# Patient Record
Sex: Female | Born: 1970 | Race: White | Hispanic: No | Marital: Married
Health system: Southern US, Community
[De-identification: ages and names within clinical notes are randomized; demographics above are authoritative.]

---

## 1999-10-13 ENCOUNTER — Ambulatory Visit (HOSPITAL_COMMUNITY): Admission: RE | Admit: 1999-10-13 | Discharge: 1999-10-13 | Payer: Self-pay | Admitting: Obstetrics and Gynecology

## 1999-10-13 ENCOUNTER — Encounter: Payer: Self-pay | Admitting: Obstetrics and Gynecology

## 2000-03-05 ENCOUNTER — Inpatient Hospital Stay (HOSPITAL_COMMUNITY): Admission: AD | Admit: 2000-03-05 | Discharge: 2000-03-07 | Payer: Self-pay | Admitting: Obstetrics and Gynecology

## 2000-06-28 ENCOUNTER — Other Ambulatory Visit: Admission: RE | Admit: 2000-06-28 | Discharge: 2000-06-28 | Payer: Self-pay | Admitting: Obstetrics and Gynecology

## 2005-02-22 ENCOUNTER — Encounter: Admission: RE | Admit: 2005-02-22 | Discharge: 2005-02-22 | Payer: Self-pay | Admitting: Obstetrics and Gynecology

## 2010-09-15 ENCOUNTER — Other Ambulatory Visit: Payer: Self-pay | Admitting: Obstetrics and Gynecology

## 2010-09-15 DIAGNOSIS — Z1231 Encounter for screening mammogram for malignant neoplasm of breast: Secondary | ICD-10-CM

## 2010-10-10 ENCOUNTER — Ambulatory Visit
Admission: RE | Admit: 2010-10-10 | Discharge: 2010-10-10 | Disposition: A | Discharge status: Home / Self Care | Payer: BC Managed Care – PPO | Source: Ambulatory Visit | Attending: Obstetrics and Gynecology | Admitting: Obstetrics and Gynecology

## 2010-10-10 DIAGNOSIS — Z1231 Encounter for screening mammogram for malignant neoplasm of breast: Secondary | ICD-10-CM

## 2010-10-13 ENCOUNTER — Other Ambulatory Visit: Payer: Self-pay | Admitting: Obstetrics and Gynecology

## 2010-10-13 DIAGNOSIS — R928 Other abnormal and inconclusive findings on diagnostic imaging of breast: Secondary | ICD-10-CM

## 2010-10-18 ENCOUNTER — Ambulatory Visit
Admission: RE | Admit: 2010-10-18 | Discharge: 2010-10-18 | Disposition: A | Discharge status: Home / Self Care | Payer: BC Managed Care – PPO | Source: Ambulatory Visit | Attending: Obstetrics and Gynecology | Admitting: Obstetrics and Gynecology

## 2010-10-18 DIAGNOSIS — R928 Other abnormal and inconclusive findings on diagnostic imaging of breast: Secondary | ICD-10-CM

## 2011-11-19 ENCOUNTER — Other Ambulatory Visit: Payer: Self-pay | Admitting: Obstetrics and Gynecology

## 2011-11-19 DIAGNOSIS — Z1231 Encounter for screening mammogram for malignant neoplasm of breast: Secondary | ICD-10-CM

## 2011-12-05 ENCOUNTER — Ambulatory Visit
Admission: RE | Admit: 2011-12-05 | Discharge: 2011-12-05 | Disposition: A | Discharge status: Home / Self Care | Payer: BC Managed Care – PPO | Source: Ambulatory Visit | Attending: Obstetrics and Gynecology | Admitting: Obstetrics and Gynecology

## 2011-12-05 DIAGNOSIS — Z1231 Encounter for screening mammogram for malignant neoplasm of breast: Secondary | ICD-10-CM

## 2017-09-17 DIAGNOSIS — J3489 Other specified disorders of nose and nasal sinuses: Secondary | ICD-10-CM | POA: Insufficient documentation

## 2018-12-26 ENCOUNTER — Other Ambulatory Visit: Payer: Self-pay

## 2018-12-26 DIAGNOSIS — Z20822 Contact with and (suspected) exposure to covid-19: Secondary | ICD-10-CM

## 2018-12-27 LAB — NOVEL CORONAVIRUS, NAA: SARS-CoV-2, NAA: NOT DETECTED

## 2019-03-23 ENCOUNTER — Other Ambulatory Visit: Payer: Self-pay | Admitting: Obstetrics and Gynecology

## 2019-03-23 DIAGNOSIS — Z1211 Encounter for screening for malignant neoplasm of colon: Secondary | ICD-10-CM

## 2019-03-31 ENCOUNTER — Other Ambulatory Visit: Payer: Self-pay | Admitting: Obstetrics and Gynecology

## 2019-03-31 DIAGNOSIS — N6489 Other specified disorders of breast: Secondary | ICD-10-CM

## 2019-03-31 HISTORY — PX: BREAST BIOPSY: SHX20

## 2019-04-09 ENCOUNTER — Other Ambulatory Visit: Payer: Self-pay

## 2019-04-09 ENCOUNTER — Ambulatory Visit
Admission: RE | Admit: 2019-04-09 | Discharge: 2019-04-09 | Disposition: A | Discharge status: Home / Self Care | Payer: Managed Care, Other (non HMO) | Source: Ambulatory Visit | Attending: Obstetrics and Gynecology | Admitting: Obstetrics and Gynecology

## 2019-04-09 ENCOUNTER — Other Ambulatory Visit: Payer: Self-pay | Admitting: Obstetrics and Gynecology

## 2019-04-09 DIAGNOSIS — N6489 Other specified disorders of breast: Secondary | ICD-10-CM

## 2019-04-10 ENCOUNTER — Ambulatory Visit
Admission: RE | Admit: 2019-04-10 | Discharge: 2019-04-10 | Disposition: A | Discharge status: Home / Self Care | Payer: Managed Care, Other (non HMO) | Source: Ambulatory Visit | Attending: Obstetrics and Gynecology | Admitting: Obstetrics and Gynecology

## 2019-04-10 ENCOUNTER — Other Ambulatory Visit: Payer: Self-pay

## 2019-04-10 DIAGNOSIS — N6489 Other specified disorders of breast: Secondary | ICD-10-CM

## 2019-07-27 ENCOUNTER — Ambulatory Visit: Payer: Managed Care, Other (non HMO)

## 2019-09-03 ENCOUNTER — Other Ambulatory Visit: Payer: Self-pay

## 2019-09-03 ENCOUNTER — Ambulatory Visit (INDEPENDENT_AMBULATORY_CARE_PROVIDER_SITE_OTHER): Payer: Managed Care, Other (non HMO) | Admitting: Family Medicine

## 2019-09-03 ENCOUNTER — Encounter: Payer: Self-pay | Admitting: Family Medicine

## 2019-09-03 VITALS — BP 104/70 | HR 82 | Ht 61.5 in | Wt 130.2 lb

## 2019-09-03 DIAGNOSIS — Z0000 Encounter for general adult medical examination without abnormal findings: Secondary | ICD-10-CM | POA: Diagnosis not present

## 2019-09-03 DIAGNOSIS — M545 Low back pain, unspecified: Secondary | ICD-10-CM | POA: Insufficient documentation

## 2019-09-03 NOTE — Progress Notes (Signed)
   Office Visit Note   Patient: Laurie Branch           Date of Birth: 05-09-1970           MRN: [id number] Visit Date: 09/03/2019 Requested by: No referring provider defined for this encounter. PCP: Patient, No Pcp Per  Subjective: Chief Complaint  Patient presents with  . Annual Exam    HPI: She's here for wellness exam.  No chronic problems, no current complaints.  GYN visits up to date.  UTD on eye and dental exams.  Hasn't had colon cancer screening.  She sees the dermatologist every couple years.               ROS: No headaches, visual disturbance, chest pain, shortness of breath, palpitations, abdominal pain, constipation or diarrhea.  No joint aches or pains.  All other systems were reviewed and are negative.  Objective: Vital Signs: BP 104/70   Pulse 82   Ht 5' 1.5" (1.562 m)   Wt 130 lb 3.2 oz (59.1 kg)   BMI 24.20 kg/m   Physical Exam:  General:  Alert and oriented, in no acute distress. Pulm:  Breathing unlabored. Psy:  Normal mood, congruent affect. Skin: No suspicious lesions. HEENT:  [place]/AT, PERRLA, EOM Full, no nystagmus.  Funduscopic examination within normal limits.  No conjunctival erythema.  Tympanic membranes are pearly gray with normal landmarks.  External ear canals are normal.  Nasal passages are clear.  Oropharynx is clear.  No significant lymphadenopathy.  No thyromegaly or nodules.  2+ carotid pulses without bruits. CV: Regular rate and rhythm without murmurs, rubs, or gallops.  No peripheral edema.  2+ radial and posterior tibial pulses. Lungs: Clear to auscultation throughout with no wheezing or areas of consolidation. Abd: Bowel sounds are active, no hepatosplenomegaly or masses.  Soft and nontender.  No audible bruits.  No evidence of ascites.   Imaging: No results found.  Assessment & Plan: 1.  Wellness examination -Labs today, Cologuard ordered. -Follow-up annually, sooner for any problems.     Procedures: No  procedures performed  No notes on file     PMFS History: Patient Active Problem List   Diagnosis Date Noted  . Low back pain 09/03/2019  . Nasal lesion 09/17/2017   History reviewed. No pertinent past medical history.  Family History  Problem Relation Age of Onset  . Asthma Maternal Grandmother   . Cancer Neg Hx   . Diabetes Neg Hx   . Heart disease Neg Hx     History reviewed. No pertinent surgical history. Social History   Occupational History  . Not on file  Tobacco Use  . Smoking status: Not on file  Substance and Sexual Activity  . Alcohol use: Not on file  . Drug use: Not on file  . Sexual activity: Not on file

## 2019-09-04 ENCOUNTER — Telehealth: Payer: Self-pay | Admitting: Family Medicine

## 2019-09-04 LAB — THYROID PANEL WITH TSH
Free Thyroxine Index: 2.5 (ref 1.4–3.8)
T3 Uptake: 33 % (ref 22–35)
T4, Total: 7.7 ug/dL (ref 5.1–11.9)
TSH: 0.81 mIU/L

## 2019-09-04 LAB — LIPID PANEL
Cholesterol: 188 mg/dL (ref <200)
HDL: 59 mg/dL (ref >=50)
LDL Cholesterol (Calc): 110 mg/dL (calc) — ABNORMAL HIGH
Non-HDL Cholesterol (Calc): 129 mg/dL (calc) (ref <130)
Total CHOL/HDL Ratio: 3.2 (calc) (ref <5.0)
Triglycerides: 94 mg/dL (ref <150)

## 2019-09-04 LAB — CBC WITH DIFFERENTIAL/PLATELET
Absolute Monocytes: 400 cells/uL (ref 200–950)
Basophils Absolute: 30 cells/uL (ref 0–200)
Basophils Relative: 0.6 %
Eosinophils Absolute: 50 cells/uL (ref 15–500)
Eosinophils Relative: 1 %
HCT: 38.7 % (ref 35.0–45.0)
Hemoglobin: 12.7 g/dL (ref 11.7–15.5)
Lymphs Abs: 1025 cells/uL (ref 850–3900)
MCH: 30.8 pg (ref 27.0–33.0)
MCHC: 32.8 g/dL (ref 32.0–36.0)
MCV: 93.9 fL (ref 80.0–100.0)
MPV: 11.4 fL (ref 7.5–12.5)
Monocytes Relative: 8 %
Neutro Abs: 3495 cells/uL (ref 1500–7800)
Neutrophils Relative %: 69.9 %
Platelets: 193 10*3/uL (ref 140–400)
RBC: 4.12 10*6/uL (ref 3.80–5.10)
RDW: 11.6 % (ref 11.0–15.0)
Total Lymphocyte: 20.5 %
WBC: 5 10*3/uL (ref 3.8–10.8)

## 2019-09-04 LAB — COMPREHENSIVE METABOLIC PANEL
AG Ratio: 1.9 (calc) (ref 1.0–2.5)
ALT: 12 U/L (ref 6–29)
AST: 13 U/L (ref 10–35)
Albumin: 4 g/dL (ref 3.6–5.1)
Alkaline phosphatase (APISO): 40 U/L (ref 37–153)
BUN: 9 mg/dL (ref 7–25)
CO2: 28 mmol/L (ref 20–32)
Calcium: 9.1 mg/dL (ref 8.6–10.4)
Chloride: 108 mmol/L (ref 98–110)
Creat: 0.62 mg/dL (ref 0.50–1.05)
Globulin: 2.1 g/dL (calc) (ref 1.9–3.7)
Glucose, Bld: 90 mg/dL (ref 65–99)
Potassium: 4.3 mmol/L (ref 3.5–5.3)
Sodium: 142 mmol/L (ref 135–146)
Total Bilirubin: 0.9 mg/dL (ref 0.2–1.2)
Total Protein: 6.1 g/dL (ref 6.1–8.1)

## 2019-09-04 LAB — VITAMIN D 25 HYDROXY (VIT D DEFICIENCY, FRACTURES): Vit D, 25-Hydroxy: 20 ng/mL — ABNORMAL LOW (ref 30–100)

## 2019-09-04 LAB — HIGH SENSITIVITY CRP: hs-CRP: 2.3 mg/L

## 2019-09-04 NOTE — Telephone Encounter (Signed)
Labs are notable for the following:  Vitamin D is very low at 20.  We want this to be 50-80.  I recommend taking vitamin D3, 5000 IU tablets, 2 of them daily for 3 months and then one daily long-term after that.  Consider rechecking in 6 to 12 months to be sure it is in target range.  All other labs look good.

## 2020-07-13 ENCOUNTER — Other Ambulatory Visit: Payer: Self-pay | Admitting: Obstetrics and Gynecology

## 2020-07-13 DIAGNOSIS — R928 Other abnormal and inconclusive findings on diagnostic imaging of breast: Secondary | ICD-10-CM

## 2020-07-29 ENCOUNTER — Ambulatory Visit: Payer: Managed Care, Other (non HMO)

## 2020-07-29 ENCOUNTER — Ambulatory Visit
Admission: RE | Admit: 2020-07-29 | Discharge: 2020-07-29 | Disposition: A | Discharge status: Home / Self Care | Payer: Managed Care, Other (non HMO) | Source: Ambulatory Visit | Attending: Obstetrics and Gynecology | Admitting: Obstetrics and Gynecology

## 2020-07-29 ENCOUNTER — Other Ambulatory Visit: Payer: Self-pay

## 2020-07-29 DIAGNOSIS — R928 Other abnormal and inconclusive findings on diagnostic imaging of breast: Secondary | ICD-10-CM

## 2021-08-07 IMAGING — MG MM DIGITAL DIAGNOSTIC UNILAT*L* W/ TOMO W/ CAD
4 series · 4 of 12 positions shown · non-contrast
Comparison: Previous exam(s).

CLINICAL DATA: Screening recall for a possible asymmetry in the
left breast.

EXAM:
DIGITAL DIAGNOSTIC LEFT MAMMOGRAM WITH CAD AND TOMO
ULTRASOUND LEFT BREAST

[L MLO synth-2D]
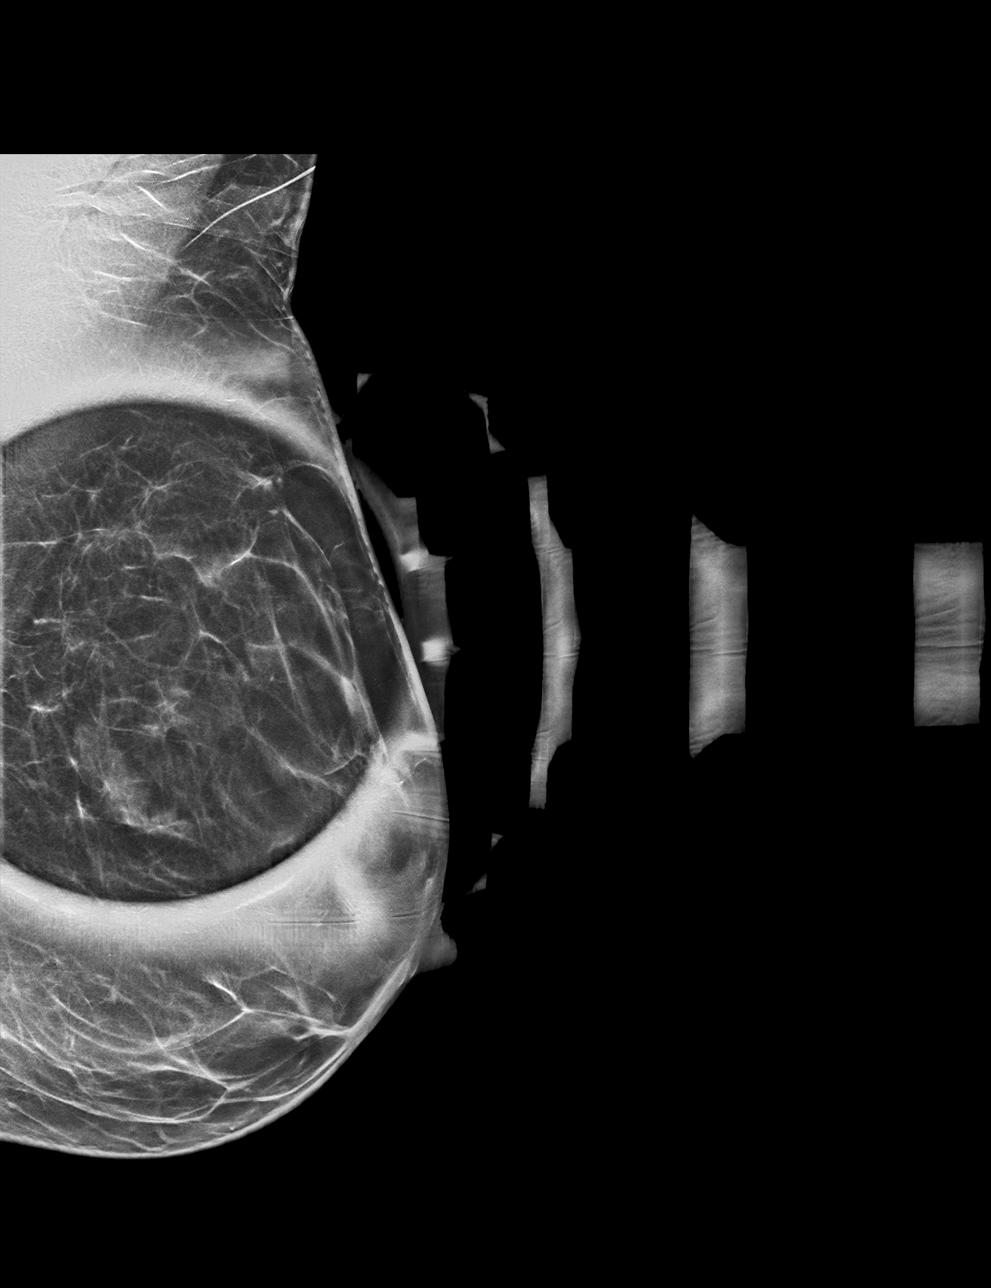

[L CC synth-2D]
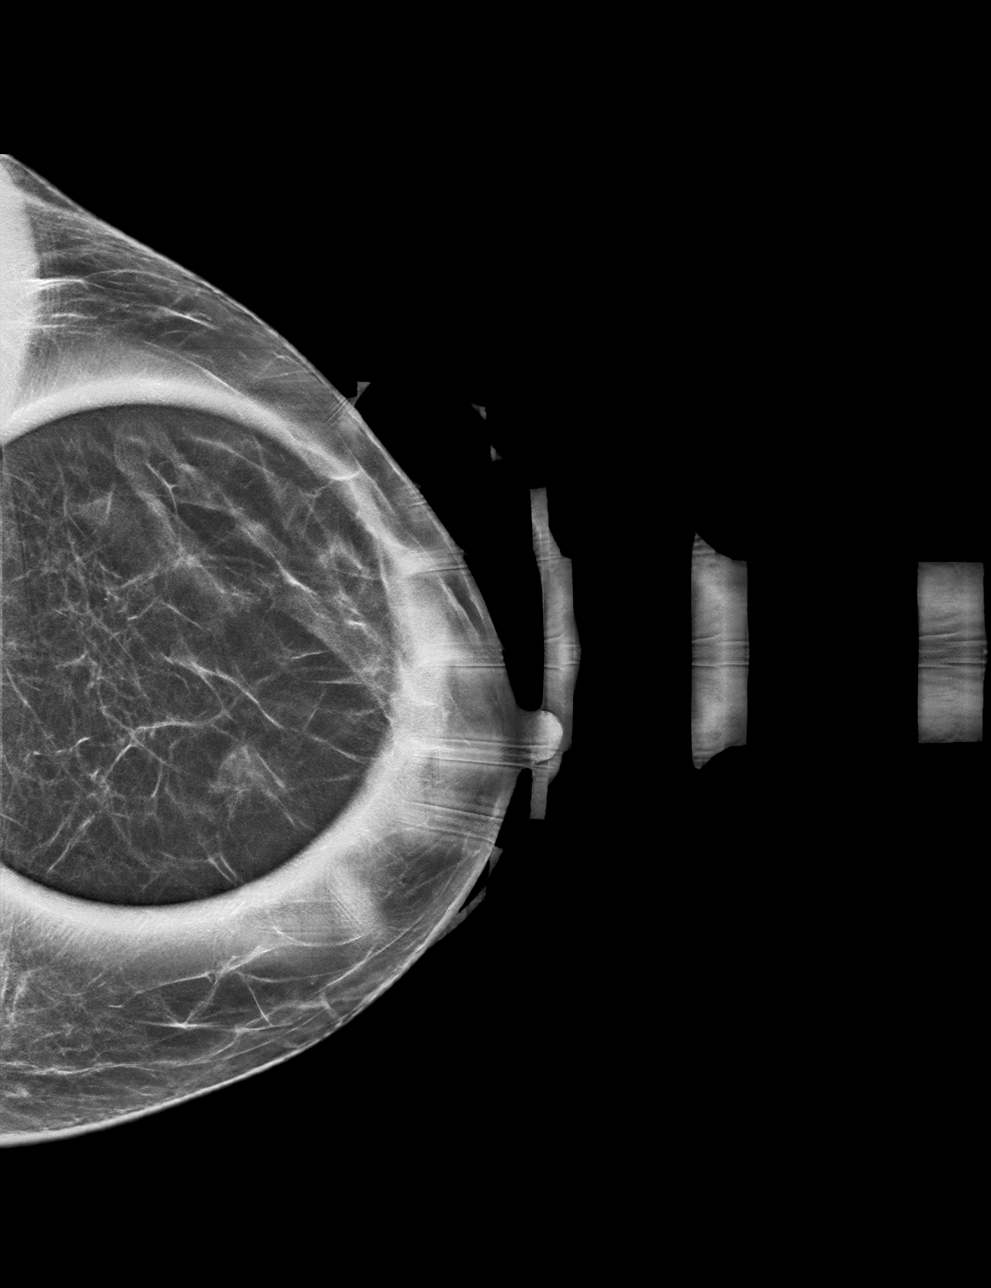

[L CC tomo · tomo slice 27/54.0]
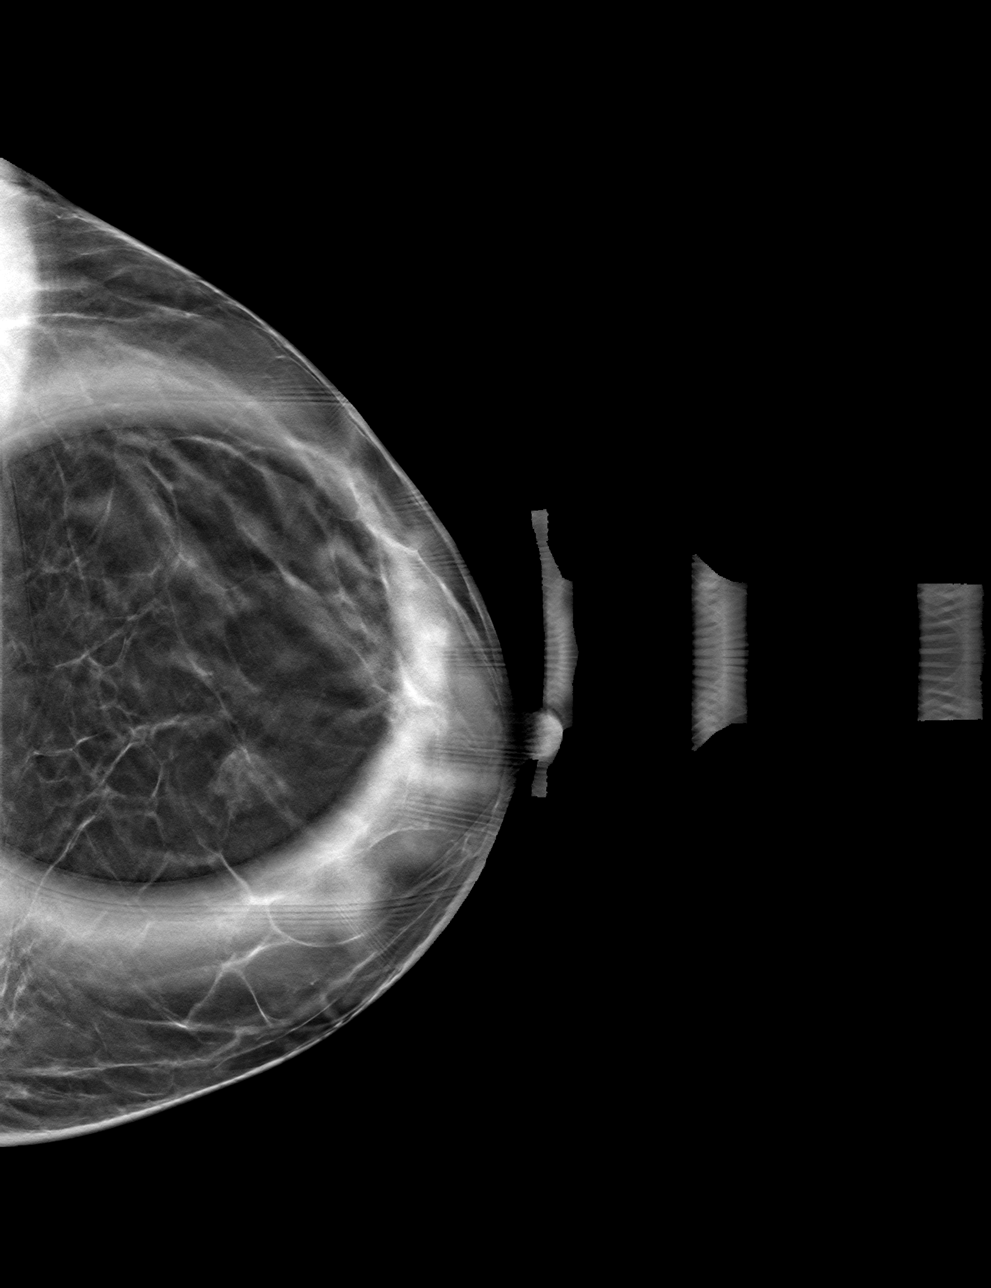

[L MLO tomo · tomo slice 27/52.0]
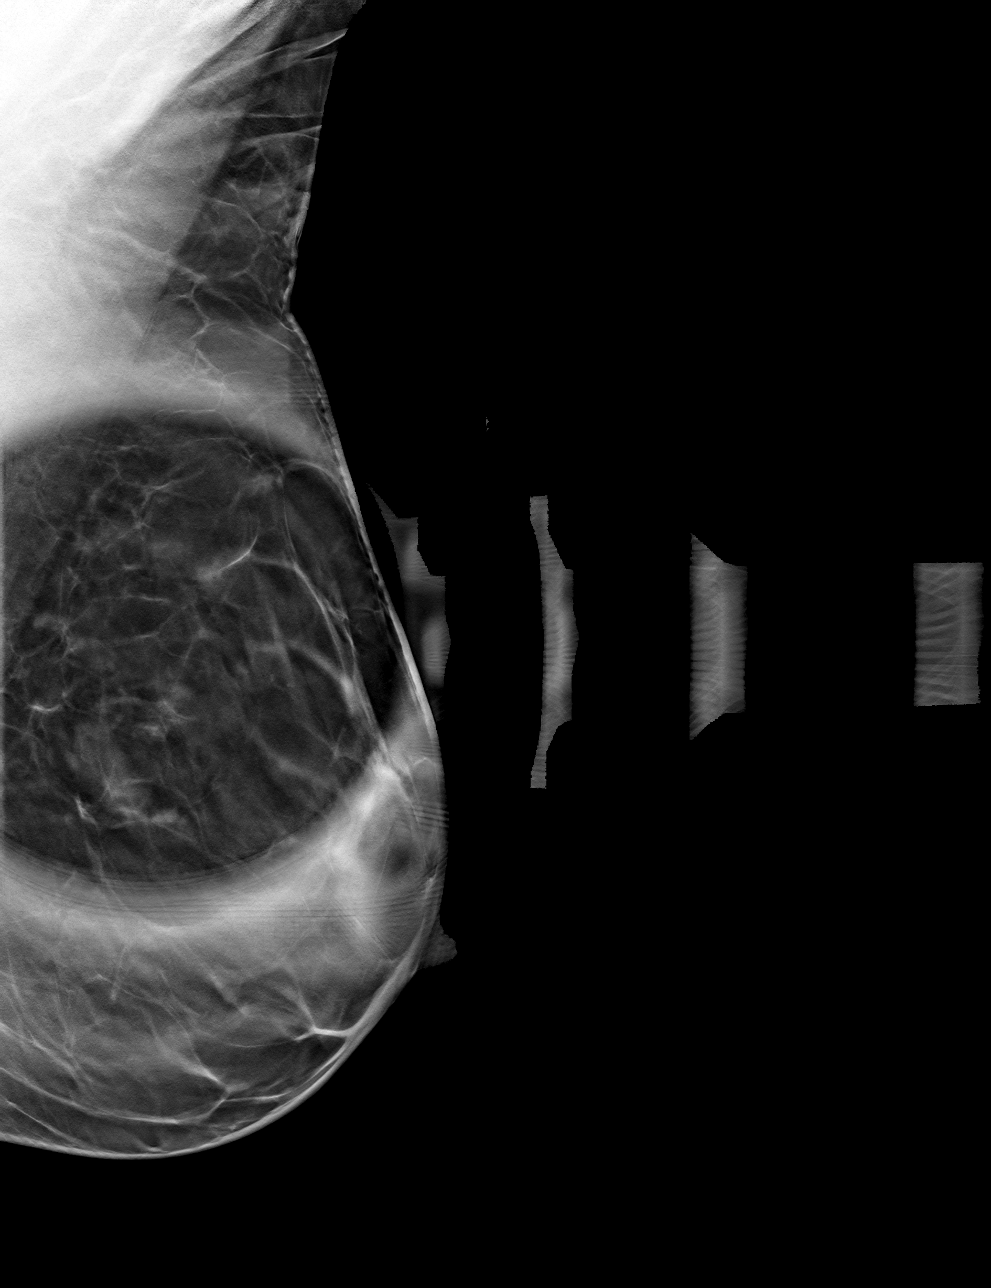

[4 of 12 positions shown; findings below may reference images not displayed]

ACR Breast Density Category c: The breast tissue is heterogeneously
dense, which may obscure small masses.
FINDINGS: In the left breast, centrally and just medial to midline, there is a
persistent asymmetry on the spot compression CC view spanning 13 mm
in greatest dimension. On the spot-compression MLO view, this
disperses without a defined residual asymmetry. There are no areas
of architectural distortion and no suspicious calcifications.

Mammographic images were processed with CAD.

On physical exam, no mass is palpated in the medial retroareolar
left breast.

Targeted ultrasound is performed, showing normal tissue throughout
the medial retroareolar left breast. No mass or suspicious lesion.

Sonographic evaluation of the left axilla shows no enlarged or
abnormal lymph nodes.
IMPRESSION: 1. Developing asymmetry in the central medial aspect of the left
breast, persisting on the spot compression CC view. Tissue sampling
is recommended.

RECOMMENDATION:
1. Stereotactic core needle biopsy of the left breast asymmetry.
This procedure was scheduled prior to the patient leaving the [REDACTED].

I have discussed the findings and recommendations with the patient.
If applicable, a reminder letter will be sent to the patient
regarding the next appointment.

BI-RADS CATEGORY  4: Suspicious.

## 2021-08-07 IMAGING — US US BREAST*L* LIMITED INC AXILLA
1 series · 3 of 3 positions shown · non-contrast
Comparison: Previous exam(s).

CLINICAL DATA: Screening recall for a possible asymmetry in the
left breast.

EXAM:
DIGITAL DIAGNOSTIC LEFT MAMMOGRAM WITH CAD AND TOMO
ULTRASOUND LEFT BREAST

[Series 1: us breast*left* limited inc axilla · 0.05mm/px · 3 of 3 slices shown]
[im 1/3]
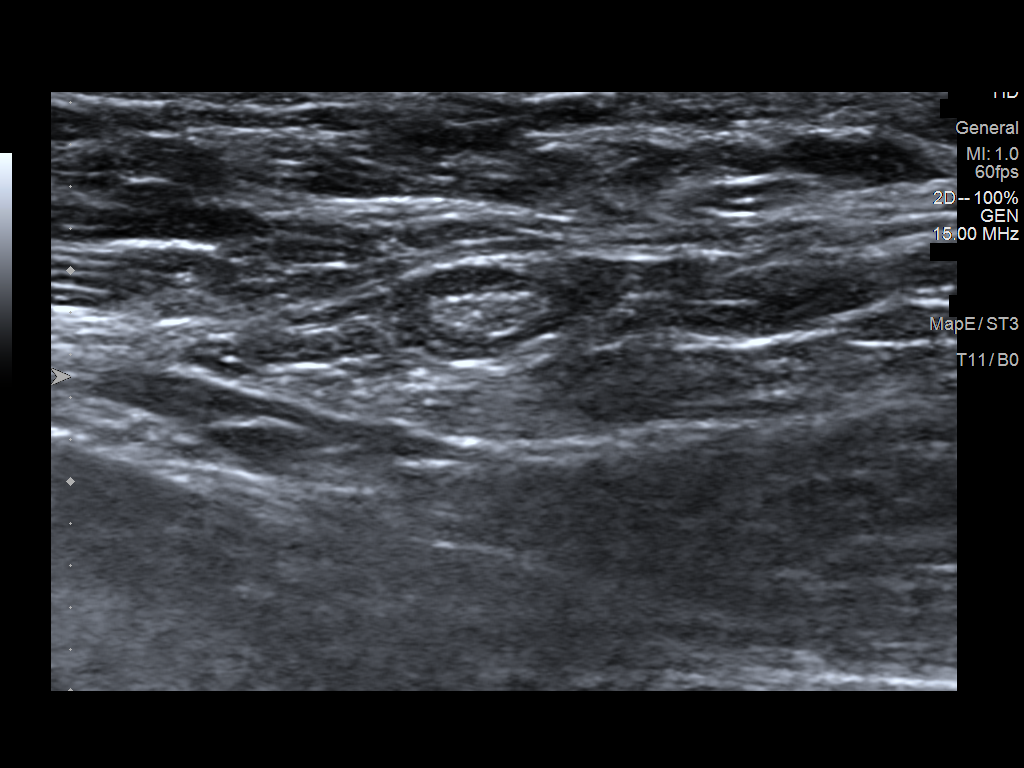
[im 2/3]
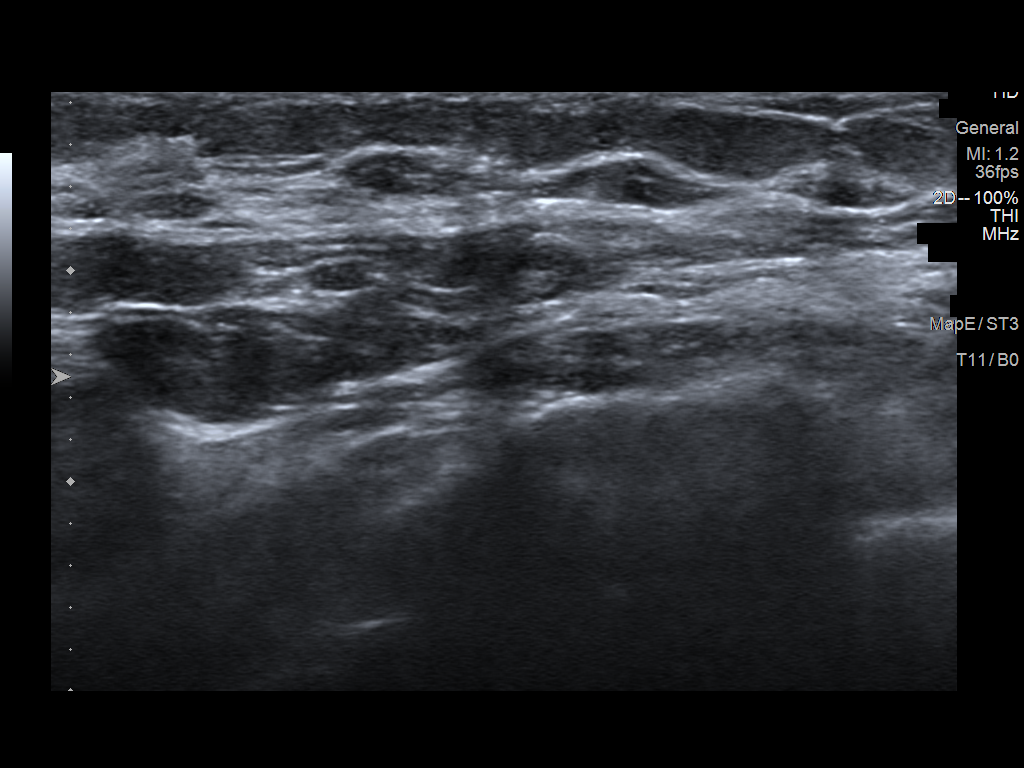
[im 3/3]
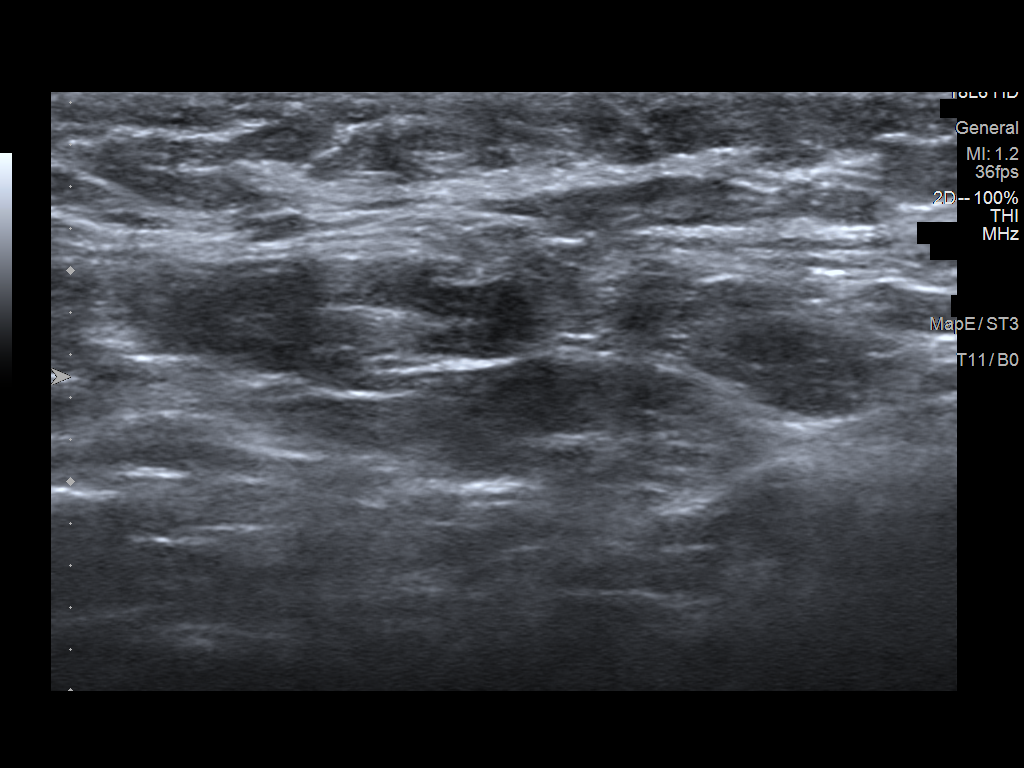

[3 of 3 positions shown; findings below may reference images not displayed]

ACR Breast Density Category c: The breast tissue is heterogeneously
dense, which may obscure small masses.
FINDINGS: In the left breast, centrally and just medial to midline, there is a
persistent asymmetry on the spot compression CC view spanning 13 mm
in greatest dimension. On the spot-compression MLO view, this
disperses without a defined residual asymmetry. There are no areas
of architectural distortion and no suspicious calcifications.

Mammographic images were processed with CAD.

On physical exam, no mass is palpated in the medial retroareolar
left breast.

Targeted ultrasound is performed, showing normal tissue throughout
the medial retroareolar left breast. No mass or suspicious lesion.

Sonographic evaluation of the left axilla shows no enlarged or
abnormal lymph nodes.
IMPRESSION: 1. Developing asymmetry in the central medial aspect of the left
breast, persisting on the spot compression CC view. Tissue sampling
is recommended.

RECOMMENDATION:
1. Stereotactic core needle biopsy of the left breast asymmetry.
This procedure was scheduled prior to the patient leaving the [REDACTED].

I have discussed the findings and recommendations with the patient.
If applicable, a reminder letter will be sent to the patient
regarding the next appointment.

BI-RADS CATEGORY  4: Suspicious.

## 2021-09-07 ENCOUNTER — Other Ambulatory Visit: Payer: Self-pay | Admitting: Obstetrics and Gynecology

## 2021-09-07 DIAGNOSIS — Z1231 Encounter for screening mammogram for malignant neoplasm of breast: Secondary | ICD-10-CM

## 2021-09-14 ENCOUNTER — Ambulatory Visit: Payer: Managed Care, Other (non HMO)

## 2021-09-20 ENCOUNTER — Ambulatory Visit
Admission: RE | Admit: 2021-09-20 | Discharge: 2021-09-20 | Disposition: A | Discharge status: Home / Self Care | Payer: Managed Care, Other (non HMO) | Source: Ambulatory Visit | Attending: Obstetrics and Gynecology | Admitting: Obstetrics and Gynecology

## 2021-09-20 DIAGNOSIS — Z1231 Encounter for screening mammogram for malignant neoplasm of breast: Secondary | ICD-10-CM

## 2022-08-24 ENCOUNTER — Other Ambulatory Visit: Payer: Self-pay | Admitting: Family Medicine

## 2022-08-24 DIAGNOSIS — Z1231 Encounter for screening mammogram for malignant neoplasm of breast: Secondary | ICD-10-CM

## 2022-09-26 ENCOUNTER — Ambulatory Visit
Admission: RE | Admit: 2022-09-26 | Discharge: 2022-09-26 | Disposition: A | Discharge status: Home / Self Care | Payer: Managed Care, Other (non HMO) | Source: Ambulatory Visit | Attending: Family Medicine | Admitting: Family Medicine

## 2022-09-26 DIAGNOSIS — Z1231 Encounter for screening mammogram for malignant neoplasm of breast: Secondary | ICD-10-CM

## 2023-10-21 ENCOUNTER — Other Ambulatory Visit: Payer: Self-pay | Admitting: Obstetrics and Gynecology

## 2023-10-21 DIAGNOSIS — Z1231 Encounter for screening mammogram for malignant neoplasm of breast: Secondary | ICD-10-CM

## 2023-10-23 ENCOUNTER — Ambulatory Visit

## 2023-10-30 ENCOUNTER — Ambulatory Visit
Admission: RE | Admit: 2023-10-30 | Discharge: 2023-10-30 | Disposition: A | Discharge status: Home / Self Care | Source: Ambulatory Visit | Attending: Obstetrics and Gynecology | Admitting: Obstetrics and Gynecology

## 2023-10-30 DIAGNOSIS — Z1231 Encounter for screening mammogram for malignant neoplasm of breast: Secondary | ICD-10-CM
